# Patient Record
Sex: Female | Born: 1995 | Race: White | Hispanic: No | Marital: Single | Smoking: Never smoker
Health system: Southern US, Community
[De-identification: ages and names within clinical notes are randomized; demographics above are authoritative.]

## PROBLEM LIST (undated history)

## (undated) DIAGNOSIS — Z789 Other specified health status: Secondary | ICD-10-CM

## (undated) HISTORY — PX: TONSILLECTOMY: SUR1361

---
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
## 2015-07-06 ENCOUNTER — Ambulatory Visit: Payer: Self-pay | Admitting: General Surgery

## 2015-07-13 ENCOUNTER — Ambulatory Visit (INDEPENDENT_AMBULATORY_CARE_PROVIDER_SITE_OTHER): Payer: BLUE CROSS/BLUE SHIELD | Admitting: General Surgery

## 2015-07-13 ENCOUNTER — Encounter: Payer: Self-pay | Admitting: General Surgery

## 2015-07-13 VITALS — BP 142/76 | HR 82 | Resp 14 | Ht 68.0 in | Wt 151.0 lb

## 2015-07-13 DIAGNOSIS — K824 Cholesterolosis of gallbladder: Secondary | ICD-10-CM | POA: Insufficient documentation

## 2015-07-13 NOTE — Patient Instructions (Addendum)
Laparoscopic Cholecystectomy Laparoscopic cholecystectomy is surgery to remove the gallbladder. The gallbladder is located in the upper right part of the abdomen, behind the liver. It is a storage sac for bile, which is produced in the liver. Bile aids in the digestion and absorption of fats. Cholecystectomy is often done for inflammation of the gallbladder (cholecystitis). This condition is usually caused by a buildup of gallstones (cholelithiasis) in the gallbladder. Gallstones can block the flow of bile, and that can result in inflammation and pain. In severe cases, emergency surgery may be required. If emergency surgery is not required, you will have time to prepare for the procedure. Laparoscopic surgery is an alternative to open surgery. Laparoscopic surgery has a shorter recovery time. Your common bile duct may also need to be examined during the procedure. If stones are found in the common bile duct, they may be removed. LET YOUR HEALTH CARE PROVIDER KNOW ABOUT:  Any allergies you have.  All medicines you are taking, including vitamins, herbs, eye drops, creams, and over-the-counter medicines.  Previous problems you or members of your family have had with the use of anesthetics.  Any blood disorders you have.  Previous surgeries you have had.  Any medical conditions you have. RISKS AND COMPLICATIONS Generally, this is a safe procedure. However, problems may occur, including:  Infection.  Bleeding.  Allergic reactions to medicines.  Damage to other structures or organs.  A stone remaining in the common bile duct.  A bile leak from the cyst duct that is clipped when your gallbladder is removed.  The need to convert to open surgery, which requires a larger incision in the abdomen. This may be necessary if your surgeon thinks that it is not safe to continue with a laparoscopic procedure. BEFORE THE PROCEDURE  Ask your health care provider about:  Changing or stopping your  regular medicines. This is especially important if you are taking diabetes medicines or blood thinners.  Taking medicines such as aspirin and ibuprofen. These medicines can thin your blood. Do not take these medicines before your procedure if your health care provider instructs you not to.  Follow instructions from your health care provider about eating or drinking restrictions.  Let your health care provider know if you develop a cold or an infection before surgery.  Plan to have someone take you home after the procedure.  Ask your health care provider how your surgical site will be marked or identified.  You may be given antibiotic medicine to help prevent infection. PROCEDURE  To reduce your risk of infection:  Your health care team will wash or sanitize their hands.  Your skin will be washed with soap.  An IV tube may be inserted into one of your veins.  You will be given a medicine to make you fall asleep (general anesthetic).  A breathing tube will be placed in your mouth.  The surgeon will make several small cuts (incisions) in your abdomen.  A thin, lighted tube (laparoscope) that has a tiny camera on the end will be inserted through one of the small incisions. The camera on the laparoscope will send a picture to a TV screen (monitor) in the operating room. This will give the surgeon a good view inside your abdomen.  A gas will be pumped into your abdomen. This will expand your abdomen to give the surgeon more room to perform the surgery.  Other tools that are needed for the procedure will be inserted through the other incisions. The gallbladder will   be removed through one of the incisions.  After your gallbladder has been removed, the incisions will be closed with stitches (sutures), staples, or skin glue.  Your incisions may be covered with a bandage (dressing). The procedure may vary among health care providers and hospitals. AFTER THE PROCEDURE  Your blood  pressure, heart rate, breathing rate, and blood oxygen level will be monitored often until the medicines you were given have worn off.  You will be given medicines as needed to control your pain.   This information is not intended to replace advice given to you by your health care provider. Make sure you discuss any questions you have with your health care provider.   Document Released: 09/19/2005 Document Revised: 06/10/2015 Document Reviewed: 05/01/2013 Elsevier Interactive Patient Education 2016 [employer]  Patient's surgery has been scheduled for 09-17-15 at [facility].

## 2015-07-13 NOTE — Progress Notes (Signed)
Patient ID: Laura Goodman, female   DOB: 1996-01-22, 19 y.o.   MRN: [id number]  Chief Complaint  Patient presents with  . Other    gallbladder  polyps    HPI Laura Goodman is a 19 y.o. female here today for evaluation for gallbladder polyps. Patient had an ultrasound done at [facility] on 05/18/15. Patient states the pain has been going on for 2 months, pain does come and go it is located near her right upper quadrant area. She reports she has bowel movements after every meal since the pain has started. No specific foods trigger the bowel movements after she eats. The bowel movements occur within an hour of meals. She will not necessarily have pain with all solid oral intake. No radiation of the pain into the back, but it does move into the right lateral abdominal wall. 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  The patient has been making use of oral contraception is for 3 years without difficulty. No other medication changes.  HPI  No past medical history on file.  Past Surgical History  Procedure Laterality Date  . Wisdom tooth extraction  2015    Family History  Problem Relation Age of Onset  . Ehlers-Danlos syndrome Sister   . Polycystic ovary syndrome Sister   . Cancer Paternal Aunt     Breast  . Cancer Paternal Grandmother     Colon     Social History Social History  Substance Use Topics  . Smoking status: Never Smoker   . Smokeless tobacco: None  . Alcohol Use: No    Allergies  Allergen Reactions  . Other Rash    Uncoded Allergy. Allergen: CEFZIL    Current Outpatient Prescriptions  Medication Sig Dispense Refill  . Multiple Vitamins-Minerals (MULTIVITAMIN WITH MINERALS) tablet Take 1 tablet by mouth daily.    . TRI-SPRINTEC 0.18/0.215/0.25 MG-35 MCG tablet      No current facility-administered medications for this visit.    Review of Systems Review of Systems  Constitutional: Negative.   Respiratory: Negative.   Cardiovascular:  Negative.   Gastrointestinal: Positive for abdominal pain and diarrhea.    Blood pressure 142/76, pulse 82, resp. rate 14, height  (1.727 m), weight 151 lb (68.493 kg), last menstrual period 07/07/2015.  Physical Exam Physical Exam  Constitutional: She is oriented to person, place, and time. She appears well-developed and well-nourished.  Eyes: Conjunctivae are normal. No scleral icterus.  Neck: Neck supple.  Cardiovascular: Normal rate, regular rhythm and normal heart sounds.   Pulmonary/Chest: Effort normal and breath sounds normal.  Abdominal: Soft. Bowel sounds are normal.  Lymphadenopathy:    She has no cervical adenopathy.  Neurological: She is alert and oriented to person, place, and time.  Skin: Skin is warm and dry.    Data Reviewed [facility] notes dated 05/12/2015 as well as associated laboratory and radiologic studies were reviewed.  Urinalysis was negative.  Follow-up exam on 06/03/2015 showed a normal hemogram with a hemoglobin of 13, white blood cell count of 5900, normal competence metabolic panel.  Abdominal ultrasound completed at [facility] showed a 7.3 m (centimeters recorded) echogenic focus in the neck of the gallbladder possibly related to gallbladder polyp. No definite gallstones. No gallbladder wall thickening or pericholecystic fluid. 3 month follow-up recommended.  Assessment    Biliary colic, gallbladder polyp.    Plan    Options for management were reviewed with the patient and her father, Laura Goodman. 1)  observation with follow-up ultrasounds to confirm the polyp resolves or stasis stable in size versus 2) elective cholecystectomy.  At this time the patient is looking to proceed with gallbladder removal, but does not want to miss school. Her pain is not so severe at present, and with no stones identified on ultrasound the risk of acute cholecystitis is small, and choledocholithiasis is absent.     Laparoscopic Cholecystectomy with  Intraoperative Cholangiogram. The procedure, including it's potential risks and complications (including but not limited to infection, bleeding, injury to intra-abdominal organs or bile ducts, bile leak, poor cosmetic result, sepsis and death) were discussed with the patient in detail. Non-operative options, including their inherent risks (acute calculous cholecystitis with possible choledocholithiasis or gallstone pancreatitis, with the risk of ascending cholangitis, sepsis, and death) were discussed as well. The patient expressed and understanding of what we discussed and wishes to proceed with laparoscopic cholecystectomy. The patient further understands that if it is technically not possible, or it is unsafe to proceed laparoscopically, that I will convert to an open cholecystectomy.  The patient was encouraged to call if the pain episodes become more severe, certainly if she develops nausea and vomiting.  Patient's surgery has been scheduled for 09-17-15 at [facility].   PCP: Guerry Bruin, MD   Earline Mayotte 07/13/2015, 4:30 PM

## 2015-09-07 ENCOUNTER — Encounter: Payer: Self-pay | Admitting: *Deleted

## 2015-09-07 ENCOUNTER — Other Ambulatory Visit: Payer: Self-pay

## 2015-09-07 NOTE — Patient Instructions (Signed)
  Your procedure is scheduled on: 09-17-15 Report to [facility] 2ND FLOOR To find out your arrival time please call [phone] between 1PM - 3PM on 09-16-15  Remember: Instructions that are not followed completely may result in serious medical risk, up to and including death, or upon the discretion of your surgeon and anesthesiologist your surgery may need to be rescheduled.    _X___ 1. Do not eat food or drink liquids after midnight. No gum chewing or hard candies.     _X___ 2. No Alcohol for 24 hours before or after surgery.   ____ 3. Bring all medications with you on the day of surgery if instructed.    ____ 4. Notify your doctor if there is any change in your medical condition     (cold, fever, infections).     Do not wear jewelry, make-up, hairpins, clips or nail polish.  Do not wear lotions, powders, or perfumes. You may wear deodorant.  Do not shave 48 hours prior to surgery. Men may shave face and neck.  Do not bring valuables to the hospital.    [facility] any belongings or valuables.               Contacts, dentures or bridgework may not be worn into surgery.  Leave your suitcase in the car. After surgery it may be brought to your room.  For patients admitted to the hospital, discharge time is determined by your  treatment team.   Patients discharged the day of surgery will not be allowed to drive home.   Please read over the following fact sheets that you were given:      ____ Take these medicines the morning of surgery with A SIP OF WATER:    1. NONE  2.   3.   4.  5.  6.  ____ Fleet Enema (as directed)   ____ Use CHG Soap as directed  ____ Use inhalers on the day of surgery  ____ Stop metformin 2 days prior to surgery    ____ Take 1/2 of usual insulin dose the night before surgery and none on the morning of surgery.   ____ Stop Coumadin/Plavix/aspirin-N/A  ____ Stop Anti-inflammatories   ____ Stop  supplements until after surgery.    ____ Bring C-Pap to the hospital.

## 2015-09-17 ENCOUNTER — Ambulatory Visit: Payer: BLUE CROSS/BLUE SHIELD

## 2015-09-17 ENCOUNTER — Ambulatory Visit
Admission: RE | Admit: 2015-09-17 | Discharge: 2015-09-17 | Disposition: A | Discharge status: Home / Self Care | Payer: BLUE CROSS/BLUE SHIELD | Source: Ambulatory Visit | Attending: General Surgery | Admitting: General Surgery

## 2015-09-17 ENCOUNTER — Ambulatory Visit: Payer: BLUE CROSS/BLUE SHIELD | Admitting: Anesthesiology

## 2015-09-17 ENCOUNTER — Encounter
Admission: RE | Disposition: A | Discharge status: Home / Self Care | Payer: Self-pay | Source: Ambulatory Visit | Attending: General Surgery

## 2015-09-17 DIAGNOSIS — J029 Acute pharyngitis, unspecified: Secondary | ICD-10-CM | POA: Diagnosis not present

## 2015-09-17 DIAGNOSIS — K824 Cholesterolosis of gallbladder: Secondary | ICD-10-CM | POA: Diagnosis not present

## 2015-09-17 DIAGNOSIS — K8010 Calculus of gallbladder with chronic cholecystitis without obstruction: Secondary | ICD-10-CM | POA: Diagnosis present

## 2015-09-17 DIAGNOSIS — R109 Unspecified abdominal pain: Secondary | ICD-10-CM | POA: Diagnosis not present

## 2015-09-17 DIAGNOSIS — K8020 Calculus of gallbladder without cholecystitis without obstruction: Secondary | ICD-10-CM

## 2015-09-17 HISTORY — DX: Other specified health status: Z78.9

## 2015-09-17 HISTORY — PX: CHOLECYSTECTOMY: SHX55

## 2015-09-17 LAB — POCT PREGNANCY, URINE: PREG TEST UR: NEGATIVE

## 2015-09-17 SURGERY — LAPAROSCOPIC CHOLECYSTECTOMY WITH INTRAOPERATIVE CHOLANGIOGRAM
Anesthesia: General | Wound class: Clean Contaminated

## 2015-09-17 MED ORDER — ACETAMINOPHEN 10 MG/ML IV SOLN
INTRAVENOUS | Status: DC | PRN
  Administered 2015-09-17: 1000 mg via INTRAVENOUS

## 2015-09-17 MED ORDER — LIDOCAINE HCL (CARDIAC) 20 MG/ML IV SOLN
INTRAVENOUS | Status: DC | PRN
  Administered 2015-09-17: 100 mg via INTRAVENOUS

## 2015-09-17 MED ORDER — GLYCOPYRROLATE 0.2 MG/ML IJ SOLN
INTRAMUSCULAR | Status: DC | PRN
  Administered 2015-09-17: .8 mg via INTRAVENOUS

## 2015-09-17 MED ORDER — DEXAMETHASONE SODIUM PHOSPHATE 10 MG/ML IJ SOLN
INTRAMUSCULAR | Status: DC | PRN
  Administered 2015-09-17: 10 mg via INTRAVENOUS

## 2015-09-17 MED ORDER — FAMOTIDINE 20 MG PO TABS
20.0000 mg | ORAL_TABLET | Freq: Once | ORAL | Status: AC
  Administered 2015-09-17: 20 mg via ORAL

## 2015-09-17 MED ORDER — PROMETHAZINE HCL 25 MG/ML IJ SOLN: 12.5000 mg | INTRAMUSCULAR | Status: DC | PRN

## 2015-09-17 MED ORDER — ROCURONIUM BROMIDE 100 MG/10ML IV SOLN
INTRAVENOUS | Status: DC | PRN
  Administered 2015-09-17: 10 mg via INTRAVENOUS
  Administered 2015-09-17: 30 mg via INTRAVENOUS

## 2015-09-17 MED ORDER — LACTATED RINGERS IV SOLN
INTRAVENOUS | Status: DC
  Administered 2015-09-17 (×3): via INTRAVENOUS

## 2015-09-17 MED ORDER — PROPOFOL 10 MG/ML IV BOLUS
INTRAVENOUS | Status: DC | PRN
  Administered 2015-09-17: 150 mg via INTRAVENOUS

## 2015-09-17 MED ORDER — SODIUM CHLORIDE 0.9 % IJ SOLN
INTRAMUSCULAR | Status: AC
  Filled 2015-09-17: qty 50

## 2015-09-17 MED ORDER — FENTANYL CITRATE (PF) 100 MCG/2ML IJ SOLN
25.0000 ug | INTRAMUSCULAR | Status: DC | PRN
  Administered 2015-09-17 (×4): 25 ug via INTRAVENOUS

## 2015-09-17 MED ORDER — HYDROCODONE-ACETAMINOPHEN 5-325 MG PO TABS: 1.0000 | ORAL_TABLET | ORAL | Status: DC | PRN

## 2015-09-17 MED ORDER — FENTANYL CITRATE (PF) 100 MCG/2ML IJ SOLN
INTRAMUSCULAR | Status: DC | PRN
  Administered 2015-09-17: 100 ug via INTRAVENOUS
  Administered 2015-09-17: 50 ug via INTRAVENOUS

## 2015-09-17 MED ORDER — CLINDAMYCIN PHOSPHATE 600 MG/50ML IV SOLN
INTRAVENOUS | Status: AC
  Filled 2015-09-17: qty 50

## 2015-09-17 MED ORDER — HYDROMORPHONE HCL 1 MG/ML IJ SOLN
INTRAMUSCULAR | Status: DC | PRN
  Administered 2015-09-17 (×2): .6 mg via INTRAVENOUS

## 2015-09-17 MED ORDER — SODIUM CHLORIDE 0.9 % IV SOLN
INTRAVENOUS | Status: DC | PRN
  Administered 2015-09-17: 10 mL

## 2015-09-17 MED ORDER — NEOSTIGMINE METHYLSULFATE 10 MG/10ML IV SOLN
INTRAVENOUS | Status: DC | PRN
  Administered 2015-09-17: 4 mg via INTRAVENOUS

## 2015-09-17 MED ORDER — HYDROCODONE-ACETAMINOPHEN 5-325 MG PO TABS
1.0000 | ORAL_TABLET | ORAL | Status: DC | PRN
  Administered 2015-09-17: 1 via ORAL

## 2015-09-17 MED ORDER — DEXMEDETOMIDINE HCL 200 MCG/2ML IV SOLN
INTRAVENOUS | Status: DC | PRN
  Administered 2015-09-17: 8 ug via INTRAVENOUS

## 2015-09-17 MED ORDER — CLINDAMYCIN PHOSPHATE 600 MG/50ML IV SOLN
INTRAVENOUS | Status: DC | PRN
  Administered 2015-09-17: 600 mg via INTRAVENOUS

## 2015-09-17 MED ORDER — ACETAMINOPHEN 10 MG/ML IV SOLN
INTRAVENOUS | Status: AC
  Filled 2015-09-17: qty 100

## 2015-09-17 MED ORDER — ONDANSETRON HCL 4 MG/2ML IJ SOLN
4.0000 mg | Freq: Once | INTRAMUSCULAR | Status: AC | PRN
  Administered 2015-09-17 (×2): 4 mg via INTRAVENOUS

## 2015-09-17 MED ORDER — KETOROLAC TROMETHAMINE 30 MG/ML IJ SOLN
INTRAMUSCULAR | Status: DC | PRN
  Administered 2015-09-17: 30 mg via INTRAVENOUS

## 2015-09-17 SURGICAL SUPPLY — 40 items
APPLIER CLIP ROT 10 11.4 M/L (STAPLE) ×2
BENZOIN TINCTURE PRP APPL 2/3 (GAUZE/BANDAGES/DRESSINGS) ×2 IMPLANT
BLADE SURG 11 STRL SS SAFETY (MISCELLANEOUS) ×2 IMPLANT
CANISTER SUCT 1200ML W/VALVE (MISCELLANEOUS) ×2 IMPLANT
CANNULA DILATOR 10 W/SLV (CANNULA) ×2 IMPLANT
CANNULA DILATOR 5 W/SLV (CANNULA) ×4 IMPLANT
CATH CHOLANG 76X19 KUMAR (CATHETERS) ×2 IMPLANT
CHLORAPREP W/TINT 26ML (MISCELLANEOUS) ×2 IMPLANT
CLIP APPLIE ROT 10 11.4 M/L (STAPLE) ×1 IMPLANT
CONRAY 60ML FOR OR (MISCELLANEOUS) ×2 IMPLANT
DISSECTOR KITTNER STICK (MISCELLANEOUS) IMPLANT
DISSECTORS/KITTNER STICK (MISCELLANEOUS)
DRAPE SHEET LG 3/4 BI-LAMINATE (DRAPES) ×2 IMPLANT
DRESSING TELFA 4X3 1S ST N-ADH (GAUZE/BANDAGES/DRESSINGS) ×2 IMPLANT
DRSG TEGADERM 2-3/8X2-3/4 SM (GAUZE/BANDAGES/DRESSINGS) ×8 IMPLANT
DRSG TEGADERM 2X2.25 PEDS (GAUZE/BANDAGES/DRESSINGS) ×2 IMPLANT
ENDOPOUCH RETRIEVER 10 (MISCELLANEOUS) IMPLANT
GLOVE BIO SURGEON STRL SZ7.5 (GLOVE) ×6 IMPLANT
GLOVE INDICATOR 8.0 STRL GRN (GLOVE) ×6 IMPLANT
GOWN STRL REUS W/ TWL LRG LVL3 (GOWN DISPOSABLE) ×3 IMPLANT
GOWN STRL REUS W/TWL LRG LVL3 (GOWN DISPOSABLE) ×3
IRRIGATION STRYKERFLOW (MISCELLANEOUS) ×1 IMPLANT
IRRIGATOR STRYKERFLOW (MISCELLANEOUS) ×2
IV LACTATED RINGERS 1000ML (IV SOLUTION) ×2 IMPLANT
KIT RM TURNOVER STRD PROC AR (KITS) ×2 IMPLANT
LABEL OR SOLS (LABEL) ×2 IMPLANT
NDL INSUFF ACCESS 14 VERSASTEP (NEEDLE) ×2 IMPLANT
NS IRRIG 500ML POUR BTL (IV SOLUTION) ×2 IMPLANT
PACK LAP CHOLECYSTECTOMY (MISCELLANEOUS) ×2 IMPLANT
PAD GROUND ADULT SPLIT (MISCELLANEOUS) ×2 IMPLANT
SCISSORS METZENBAUM CVD 33 (INSTRUMENTS) ×2 IMPLANT
SEAL FOR SCOPE WARMER C3101 (MISCELLANEOUS) ×2 IMPLANT
STRIP CLOSURE SKIN 1/2X4 (GAUZE/BANDAGES/DRESSINGS) ×2 IMPLANT
SUT PROLENE 2 0 SH DA (SUTURE) ×2 IMPLANT
SUT VIC AB 0 CT2 27 (SUTURE) ×2 IMPLANT
SUT VIC AB 4-0 FS2 27 (SUTURE) ×2 IMPLANT
SWABSTK COMLB BENZOIN TINCTURE (MISCELLANEOUS) ×2 IMPLANT
TROCAR XCEL NON-BLD 11X100MML (ENDOMECHANICALS) ×2 IMPLANT
TUBING INSUFFLATOR HI FLOW (MISCELLANEOUS) ×2 IMPLANT
WATER STERILE IRR 1000ML POUR (IV SOLUTION) ×2 IMPLANT

## 2015-09-17 NOTE — H&P (Signed)
Sylvie Farrierlanna R Scully [id number] 04/30/1996     HPI: 19 year old female with abdominal pain previous ultrasound showing evidence of a suspected gallbladder polyp in the neck. Episodic pain continues in the right upper quadrant.  24-hour history of mild sore throat and sinus drainage. No cough. No urinary symptoms.  Prescriptions prior to admission  Medication Sig Dispense Refill Last Dose  . Multiple Vitamins-Minerals (MULTIVITAMIN WITH MINERALS) tablet Take 1 tablet by mouth daily.   Past Week at Unknown time  . TRI-SPRINTEC 0.18/0.215/0.25 MG-35 MCG tablet    09/16/2015 at Unknown time   Allergies  Allergen Reactions  . Other Rash    Uncoded Allergy. Allergen: CEFZIL   Past Medical History  Diagnosis Date  . Medical history non-contributory    Past Surgical History  Procedure Laterality Date  . Wisdom tooth extraction  2015   Social History   Social History  . Marital Status: Single    Spouse Name: N/A  . Number of Children: N/A  . Years of Education: N/A   Occupational History  . Not on file.   Social History Main Topics  . Smoking status: Never Smoker   . Smokeless tobacco: Not on file  . Alcohol Use: No  . Drug Use: No  . Sexual Activity: Not on file   Other Topics Concern  . Not on file   Social History Narrative   Social History   Social History Narrative     ROS: Negative.     PE: HEENT: Moderate pharyngitis noted over the tonsils. Lungs: Clear. Cardio: RR. Abdomen: Soft. Earline MayotteByrnett, Camira Geidel W 09/17/2015   Assessment/Plan:  Temperature noted this morning of 100.1 likely secondary to URI symptoms. Suturing the patient's persistent abdominal pain, we'll proceed with planned cholecystectomy. Patient will receive Ancef on induction of anesthesia.

## 2015-09-17 NOTE — Op Note (Signed)
Preoperative diagnosis: Chronic cholecystitis, gallbladder polyp.  Postoperative diagnosis: Chronic cholecystitis and cholelithiasis.  Operative procedure: Laparoscopic cholecystectomy with intraoperative cholangiograms.  Operating surgeon: Ollen Bowl, M.D.  Anesthesia: Gen. endotracheal.  Estimated blood loss: 5 mL.  Clinical note: This 19 year old woman has had episodic postprandial abdominal pain. Ultrasound showed evidence of a gallbladder polyp. No gallbladder wall thickening. Normal common bile duct. She is felt to be a candidate for elective cholecystectomy. The morning of surgery she was noted to have low-grade fever 100.1 with erythema and exudate of the tonsils. No other symptoms or clinical findings. She received clindamycin 6 her milligrams prior to procedure.  Operative note: With the patient under adequate general anesthesia the abdomen was prepped with ChloraPrep and draped. A varies needle was placed which answered umbilical incision. In spite of a normal hanging drop test insufflation was abnormal. It appeared that the needle was in the preperitoneal space. On initial passage of the 10 mm step port showed that the catheter was indeed in the preperitoneal space. It was elected BRCA2 a open technique. The incision was extended cephalad and the fascia incised and the peritoneum entered. A 2-0 Prolene baseball stitch was placed and pneumoperitoneum undertaken at 10 mmHg pressure with a step port. An 11 mm XL port was placed in the epigastrium. Examination of the umbilical area showed evidence of air insufflated into the falciform ligament as well as into the soft tissues between the peritoneum and the abdominal fascia. No evidence of bleeding. No evidence of bowel injury.  At the patient in reverse Trendelenburg position and rolled to left 25 mm step ports were placed laterally. The gallbladder was noted to be tensely distended although not inflamed. This was decompressed with a  needle catheter. Clear yellow bile was noted. The gallbladder was placed on cephalad traction. The neck of the gallbladder was clear. Fluoroscopic cholangiograms were completed using one half strength Conray 60, 10 mL volume. This showed prompt filling of the right and left hepatic ducts, free flow in the duodenum and no evidence of retained stones. The cystic duct and single branch the cystic artery was doubly clipped and divided. The gallbladder is removed the liver bed making use of hook cautery dissection and delivered to the umbilical port site. At the end of the procedure the open gallbladder showed multiple stones. The right upper quadrant was irrigated with lactated Ringer solution. Good hemostasis was noted. Final inspection from the epigastric site showed no abnormalities outside of those noted above. No evidence of intestinal injury. The abdomen was then desufflated under direct vision and ports removed. The stitch at the umbilicus was snugged down to close the fascial defect. Skin incisions were closed with interrupted 4-0 Vicryl septic sutures. Benzoin, Steri-Strips, Telfa and Tegaderm dressings were then applied.  The patient tolerated the procedure well and was taken to recovery room stable condition

## 2015-09-17 NOTE — Addendum Note (Signed)
Addendum  created 09/17/15 1315 by Almeta MonasPatricia Iva Montelongo, CRNA   Modules edited: Anesthesia Attestations

## 2015-09-17 NOTE — OR Nursing (Signed)
Dr Lemar LivingsByrnett notified of patient temp 100.  No new orders at this time.

## 2015-09-17 NOTE — Anesthesia Postprocedure Evaluation (Signed)
Anesthesia Post Note  Patient: Laura FarrierAlanna R Goodman  Procedure(s) Performed: Procedure(s) (LRB): LAPAROSCOPIC CHOLECYSTECTOMY WITH INTRAOPERATIVE CHOLANGIOGRAM (N/A)  Patient location during evaluation: PACU Anesthesia Type: General Level of consciousness: awake and alert Pain management: pain level controlled Vital Signs Assessment: post-procedure vital signs reviewed and stable Respiratory status: spontaneous breathing, nonlabored ventilation, respiratory function stable and patient connected to nasal cannula oxygen Cardiovascular status: blood pressure returned to baseline and stable Postop Assessment: no signs of nausea or vomiting Anesthetic complications: no    Last Vitals:  Filed Vitals:   09/17/15 1015 09/17/15 1056  BP: 89/44 86/54  Pulse: 80 76  Temp: 36.4 C   Resp: 16 16    Last Pain:  Filed Vitals:   09/17/15 1057  PainSc: 8                  Harper Vandervoort K Juel Bellerose

## 2015-09-17 NOTE — Discharge Instructions (Signed)

## 2015-09-17 NOTE — OR Nursing (Signed)
Dr. Lemar LivingsByrnett here.  Ordered Dc home.  Mother feels comfortable with care at home.  Patient is still sleepy but pain is somewhat improved.

## 2015-09-17 NOTE — Anesthesia Preprocedure Evaluation (Signed)
Anesthesia Evaluation  Patient identified by MRN, date of birth, ID band Patient awake    Reviewed: Allergy & Precautions, NPO status , Patient's Chart, lab work & pertinent test results, reviewed documented beta blocker date and time   Airway Mallampati: II  TM Distance: >3 FB     Dental  (+) Chipped   Pulmonary           Cardiovascular      Neuro/Psych    GI/Hepatic   Endo/Other    Renal/GU      Musculoskeletal   Abdominal   Peds  Hematology   Anesthesia Other Findings   Reproductive/Obstetrics                             Anesthesia Physical Anesthesia Plan  ASA: II  Anesthesia Plan: General   Post-op Pain Management:    Induction: Intravenous  Airway Management Planned: Oral ETT  Additional Equipment:   Intra-op Plan:   Post-operative Plan:   Informed Consent: I have reviewed the patients History and Physical, chart, labs and discussed the procedure including the risks, benefits and alternatives for the proposed anesthesia with the patient or authorized representative who has indicated his/her understanding and acceptance.     Plan Discussed with: CRNA  Anesthesia Plan Comments:         Anesthesia Quick Evaluation  

## 2015-09-17 NOTE — Anesthesia Procedure Notes (Signed)
Procedure Name: Intubation Date/Time: 09/17/2015 7:49 AM Performed by: Almeta MonasFLETCHER, Linnie Delgrande Pre-anesthesia Checklist: Patient identified, Emergency Drugs available, Suction available and Patient being monitored Patient Re-evaluated:Patient Re-evaluated prior to inductionOxygen Delivery Method: Circle system utilized Preoxygenation: Pre-oxygenation with 100% oxygen Intubation Type: IV induction Ventilation: Mask ventilation without difficulty Laryngoscope Size: Mac and 3 Grade View: Grade I Tube type: Oral Tube size: 7.0 mm Number of attempts: 1 Airway Equipment and Method: Stylet and Patient positioned with wedge pillow Placement Confirmation: ETT inserted through vocal cords under direct vision,  positive ETCO2,  CO2 detector and breath sounds checked- equal and bilateral Secured at: 21 cm Tube secured with: Tape Dental Injury: Teeth and Oropharynx as per pre-operative assessment

## 2015-09-17 NOTE — Transfer of Care (Signed)
Immediate Anesthesia Transfer of Care Note  Patient: Laura FarrierAlanna R Goodman  Procedure(s) Performed: Procedure(s): LAPAROSCOPIC CHOLECYSTECTOMY WITH INTRAOPERATIVE CHOLANGIOGRAM (N/A)  Patient Location: PACU  Anesthesia Type:General  Level of Consciousness: sedated  Airway & Oxygen Therapy: Patient Spontanous Breathing and Patient connected to face mask oxygen  Post-op Assessment: Report given to RN and Post -op Vital signs reviewed and stable  Post vital signs: Reviewed and stable  Last Vitals:  Filed Vitals:   09/17/15 0603 09/17/15 0900  BP: 118/67 107/45  Pulse: 111 114  Temp: 37.8 C 36.7 C  Resp: 14 14    Complications: No apparent anesthesia complications

## 2015-09-18 ENCOUNTER — Telehealth: Payer: Self-pay | Admitting: General Surgery

## 2015-09-18 DIAGNOSIS — Z9889 Other specified postprocedural states: Principal | ICD-10-CM

## 2015-09-18 DIAGNOSIS — R110 Nausea: Secondary | ICD-10-CM

## 2015-09-18 LAB — SURGICAL PATHOLOGY

## 2015-09-18 MED ORDER — PROMETHAZINE HCL 25 MG PO TABS: 25.0000 mg | ORAL_TABLET | ORAL | Status: AC | PRN

## 2015-09-18 NOTE — Telephone Encounter (Signed)
19 year old woman underwent a laparoscopic cholecystectomy for chronic cholecystitis and cholelithiasis S today. Her mother reported that she had done well with liquid just day had a soft pale today and then this afternoon had vomiting. No fever or chills. Lower abdominal pain secondary to air dissection in the preperitoneal plane is still present.  Had about a half liter of fluids today.  Aprescription for Phenergan tablets will be sent to her pharmacy.  The importance of liquids over solids for the next 24 hours was emphasized.

## 2015-09-24 ENCOUNTER — Encounter: Payer: Self-pay | Admitting: General Surgery

## 2015-09-24 ENCOUNTER — Ambulatory Visit (INDEPENDENT_AMBULATORY_CARE_PROVIDER_SITE_OTHER): Payer: BLUE CROSS/BLUE SHIELD | Admitting: General Surgery

## 2015-09-24 DIAGNOSIS — K8020 Calculus of gallbladder without cholecystitis without obstruction: Secondary | ICD-10-CM

## 2015-09-24 NOTE — Progress Notes (Signed)
Patient ID: Laura FarrierAlanna R Goodman, female   DOB: 04/01/1996, 19 y.o.   MRN: [id number]  Chief Complaint  Patient presents with  . Routine Post Op    gallbladder    HPI Laura Farrierlanna R Goodman is a 19 y.o. female here today for her post op gallbladder removal done on 09/17/15. She developed nausea and vomiting on postoperative day 1 which resolved with use of oral Phenergan. Presently not requiring any analgesics. Patient states she is doing well.   I personally reviewed the history.  HPI  Past Medical History  Diagnosis Date  . Medical history non-contributory     Past Surgical History  Procedure Laterality Date  . Wisdom tooth extraction  2015  . Cholecystectomy N/A 09/17/2015    Procedure: LAPAROSCOPIC CHOLECYSTECTOMY WITH INTRAOPERATIVE CHOLANGIOGRAM;  Surgeon: Earline MayotteJeffrey W Jedi Catalfamo, MD;  Location: [facility] ORS;  Service: General;  Laterality: N/A;    Family History  Problem Relation Age of Onset  . Ehlers-Danlos syndrome Sister   . Polycystic ovary syndrome Sister   . Cancer Paternal Aunt     Breast  . Cancer Paternal Grandmother     Colon     Social History Social History  Substance Use Topics  . Smoking status: Never Smoker   . Smokeless tobacco: None  . Alcohol Use: No    Allergies  Allergen Reactions  . Other Rash    Uncoded Allergy. Allergen: CEFZIL    Current Outpatient Prescriptions  Medication Sig Dispense Refill  . Multiple Vitamins-Minerals (MULTIVITAMIN WITH MINERALS) tablet Take 1 tablet by mouth daily.    . promethazine (PHENERGAN) 25 MG tablet Take 1 tablet (25 mg total) by mouth every 4 (four) hours as needed for nausea or vomiting. 30 tablet 0  . TRI-SPRINTEC 0.18/0.215/0.25 MG-35 MCG tablet      No current facility-administered medications for this visit.    Review of Systems Review of Systems  Constitutional: Negative.   Respiratory: Negative.   Cardiovascular: Negative.     Blood pressure 116/80, pulse 84, resp. rate 12, height 5\' 8"  (1.727 m), weight 144  lb (65.318 kg), last menstrual period 08/31/2015.  Physical Exam Physical Exam  Constitutional: She is oriented to person, place, and time. She appears well-developed and well-nourished.  Cardiovascular: Normal rate, regular rhythm and normal heart sounds.   Pulmonary/Chest: Effort normal and breath sounds normal.  Abdominal: Soft. Normal appearance. There is no tenderness.  Neurological: She is alert and oriented to person, place, and time.  Skin: Skin is warm and dry.  Psychiatric: Her behavior is normal.    Data Reviewed Pathology showed chronic cholecystitis. The stones evident in the operating room were not in the specimen container.  Assessment    Doing well status post cholecystectomy.    Plan        Gradually increase activity. Follow up as needed. The patient is aware to call back for any questions or concerns.   PCP:  Guerry BruinMakar, Vivian This information has been scribed by Ples SpecterJessica Qualls CMA.    Earline MayotteByrnett, Lavella Myren W 09/25/2015, 1:16 PM

## 2015-09-24 NOTE — Patient Instructions (Signed)
Gradually increase activity. Follow up as needed. The patient is aware to call back for any questions or concerns.

## 2015-09-25 DIAGNOSIS — K8020 Calculus of gallbladder without cholecystitis without obstruction: Secondary | ICD-10-CM | POA: Insufficient documentation

## 2020-10-27 ENCOUNTER — Encounter: Payer: Self-pay | Admitting: Dermatology

## 2020-10-27 ENCOUNTER — Other Ambulatory Visit: Payer: Self-pay

## 2020-10-27 ENCOUNTER — Ambulatory Visit: Payer: BLUE CROSS/BLUE SHIELD | Admitting: Dermatology

## 2020-10-27 DIAGNOSIS — L858 Other specified epidermal thickening: Secondary | ICD-10-CM | POA: Diagnosis not present

## 2020-10-27 DIAGNOSIS — L739 Follicular disorder, unspecified: Secondary | ICD-10-CM

## 2020-10-27 DIAGNOSIS — L700 Acne vulgaris: Secondary | ICD-10-CM

## 2020-10-27 MED ORDER — DOXYCYCLINE MONOHYDRATE 100 MG PO CAPS: 100.0000 mg | ORAL_CAPSULE | Freq: Two times a day (BID) | ORAL | 2 refills | Status: DC

## 2020-10-27 MED ORDER — DAPSONE 7.5 % EX GEL: CUTANEOUS | 2 refills | Status: DC

## 2020-10-27 MED ORDER — TRETINOIN 0.025 % EX CREA: TOPICAL_CREAM | Freq: Every evening | CUTANEOUS | 2 refills | Status: DC

## 2020-10-27 NOTE — Progress Notes (Signed)
   Follow-Up Visit   Subjective  Laura Goodman is a 25 y.o. female who presents for the following: Acne (Patient here today for acne. She did a 6 month course of accutane just over 2 years ago with good results and no side effects. She is currently not using any medications. 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 ).  Patient has some little red dots on upper arms and buttocks that started a few months ago.  The following portions of the chart were reviewed this encounter and updated as appropriate:   Allergies  Meds  Problems  Med Hx  Surg Hx  Fam Hx      Review of Systems:  No other skin or systemic complaints except as noted in HPI or Assessment and Plan.  Objective  Well appearing patient in no apparent distress; mood and affect are within normal limits.  A focused examination was performed including face, neck, chest and back and buttocks, arms. Relevant physical exam findings are noted in the Assessment and Plan.  Objective  Upper Arms: Tiny follicular keratotic papules.   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   Recommend ammonium lactate lotion daily or exfoliating gloves. Patient advised no cure but exfoliation can help smooth skin.   Acne vulgaris Face  Chronic condition with duration over one year. Condition is bothersome to patient. Currently flared. S/p course of isotretinoin approximately 2 years ago.  Start tretinoin 0.025% cream QHS as directed.  Start Aczone 7.5% QAM.  Topical retinoid medications like tretinoin/Retin-A, adapalene/Differin, tazarotene/Fabior, and Epiduo/Epiduo Forte can cause dryness and irritation when first started. Only apply a pea-sized amount to the entire  affected area. Avoid applying it around the eyes, edges of mouth and creases at the nose. If you experience irritation, use a good moisturizer first and/or apply the medicine less often. If you are doing well with the medicine, you can increase how often you use it until you are applying every night. Be careful with sun protection while using this medication as it can make you sensitive to the sun. This medicine should not be used by pregnant women.    Ordered Medications: tretinoin (RETIN-A) 0.025 % cream Dapsone (ACZONE) 7.5 % GEL  Folliculitis buttocks  Recommend Cln sport wash daily.   If not improving may start doxycycline monohydrate 100mg  twice daily with food.   Doxycycline should be taken with food to prevent nausea. Do not lay down for 30 minutes after taking. Be cautious with sun exposure and use good sun protection while on this medication. Pregnant women should not take this medication.    Ordered Medications: doxycycline (MONODOX) 100 MG capsule  Return in about 6 weeks (around 12/08/2020) for Acne.  02/07/2021, RMA, am acting as scribe for Anise Salvo, MD .  Documentation: I have reviewed the above documentation for accuracy and completeness, and I agree with the above.  Darden Dates, MD

## 2020-10-27 NOTE — Patient Instructions (Addendum)
Topical retinoid medications like tretinoin/Retin-A, adapalene/Differin, tazarotene/Fabior, and Epiduo/Epiduo Forte can cause dryness and irritation when first started. Only apply a pea-sized amount to the entire affected area. Avoid applying it around the eyes, edges of mouth and creases at the nose. If you experience irritation, use a good moisturizer first and/or apply the medicine less often. If you are doing well with the medicine, you can increase how often you use it until you are applying every night. Be careful with sun protection while using this medication as it can make you sensitive to the sun. This medicine should not be used by pregnant women.    Recommend AmLactin (ammonium lactate) daily to arms.   Doxycycline should be taken with food to prevent nausea. Do not lay down for 30 minutes after taking. Be cautious with sun exposure and use good sun protection while on this medication. Pregnant women should not take this medication.

## 2020-12-09 ENCOUNTER — Other Ambulatory Visit: Payer: Self-pay

## 2020-12-09 ENCOUNTER — Ambulatory Visit: Payer: BC Managed Care – PPO | Admitting: Dermatology

## 2020-12-09 DIAGNOSIS — L700 Acne vulgaris: Secondary | ICD-10-CM

## 2020-12-09 DIAGNOSIS — L739 Follicular disorder, unspecified: Secondary | ICD-10-CM

## 2020-12-09 MED ORDER — AMZEEQ 4 % EX FOAM: CUTANEOUS | 11 refills | Status: AC

## 2020-12-09 MED ORDER — DAPSONE 7.5 % EX GEL: CUTANEOUS | 11 refills | Status: DC

## 2020-12-09 MED ORDER — TRETINOIN 0.025 % EX CREA: TOPICAL_CREAM | Freq: Every evening | CUTANEOUS | 11 refills | Status: AC

## 2020-12-09 NOTE — Progress Notes (Signed)
   Follow-Up Visit   Subjective  Laura Goodman is a 25 y.o. female who presents for the following: Follow-up (Patient here today for 6 week acne and folliculitis follow up. She is using tretinoin 0.025% cream qhs and aczone 7.5% qam for her acne, advises she has seen some improvement. Patient was using CLN wash for folliculitis at the buttocks, wasn't improving so she started doxycycline 100mg  twice daily about 2 weeks ago. ).   The following portions of the chart were reviewed this encounter and updated as appropriate:   Allergies  Meds  Problems  Med Hx  Surg Hx  Fam Hx      Review of Systems:  No other skin or systemic complaints except as noted in HPI or Assessment and Plan.  Objective  Well appearing patient in no apparent distress; mood and affect are within normal limits.  A focused examination was performed including face, neck, chest and back. Relevant physical exam findings are noted in the Assessment and Plan.  Objective  face: Chest clear Back with trace open comedones Face with few small inflammatory papules, rare open comedones   Assessment & Plan  Acne vulgaris face  Chronic condition with duration or expected duration over one year. Condition is bothersome to patient. Not yet at goal.  Start Amzeeq nightly after applying tretinoin 0.025% cream Continue Aczone 7.5% gel QAM Continue tretinoin 0.025% cream nightly  If doing well, once clear may discontinue tretinoin    Other Related Medications tretinoin (RETIN-A) 0.025 % cream Dapsone (ACZONE) 7.5 % GEL Minocycline HCl Micronized (AMZEEQ) 4 % FOAM  Folliculitis buttocks  May continue doxycycline 100mg  twice daily with food until clear then may discontinue  Doxycycline should be taken with food to prevent nausea. Do not lay down for 30 minutes after taking. Be cautious with sun exposure and use good sun protection while on this medication. Pregnant women should not take this medication.   Consider  switching to low dose doxycycline 20mg  twice daily.   Other Related Medications doxycycline (MONODOX) 100 MG capsule  Return in about 1 year (around 12/09/2021) for Acne.  , RMA, am acting as scribe for , MD .  Documentation: I have reviewed the above documentation for accuracy and completeness, and I agree with the above.  02/08/2022, MD

## 2020-12-09 NOTE — Patient Instructions (Signed)
Topical retinoid medications like tretinoin/Retin-A, adapalene/Differin, tazarotene/Fabior, and Epiduo/Epiduo Forte can cause dryness and irritation when first started. Only apply a pea-sized amount to the entire affected area. Avoid applying it around the eyes, edges of mouth and creases at the nose. If you experience irritation, use a good moisturizer first and/or apply the medicine less often. If you are doing well with the medicine, you can increase how often you use it until you are applying every night. Be careful with sun protection while using this medication as it can make you sensitive to the sun. This medicine should not be used by pregnant women.   Doxycycline should be taken with food to prevent nausea. Do not lay down for 30 minutes after taking. Be cautious with sun exposure and use good sun protection while on this medication. Pregnant women should not take this medication.   

## 2020-12-14 ENCOUNTER — Encounter: Payer: Self-pay | Admitting: Dermatology

## 2021-03-25 ENCOUNTER — Ambulatory Visit: Payer: BC Managed Care – PPO | Admitting: Dermatology

## 2021-08-10 ENCOUNTER — Ambulatory Visit: Admission: EM | Admit: 2021-08-10 | Payer: BC Managed Care – PPO

## 2021-08-11 ENCOUNTER — Encounter: Payer: Self-pay | Admitting: Emergency Medicine

## 2021-08-11 ENCOUNTER — Other Ambulatory Visit: Payer: Self-pay

## 2021-08-11 ENCOUNTER — Ambulatory Visit
Admission: EM | Admit: 2021-08-11 | Discharge: 2021-08-11 | Disposition: A | Discharge status: Home / Self Care | Payer: BC Managed Care – PPO

## 2021-08-11 DIAGNOSIS — J0110 Acute frontal sinusitis, unspecified: Secondary | ICD-10-CM | POA: Diagnosis not present

## 2021-08-11 MED ORDER — AMOXICILLIN 875 MG PO TABS: 875.0000 mg | ORAL_TABLET | Freq: Two times a day (BID) | ORAL | 0 refills | Status: AC

## 2021-08-11 NOTE — Discharge Instructions (Addendum)
Take the amoxicillin as directed.  Follow up with your primary care provider if your symptoms are not improving.   ° ° °

## 2021-08-11 NOTE — ED Triage Notes (Signed)
Pt c/o ST, productive cough with green mucus, and ear pain x 8 days.

## 2021-08-11 NOTE — ED Provider Notes (Signed)
Renaldo Fiddler    CSN: [id number] Arrival date & time: 08/11/21  1733      History   Chief Complaint Chief Complaint  Patient presents with   Cough   Sore Throat    HPI Laura Goodman is a 25 y.o. female.  Patient presents with 8-day history of ear pain, sinus pressure, sore throat, cough productive of green phlegm.  She denies fever, chills, rash, shortness of breath, vomiting, diarrhea, or other symptoms.  Treatment attempted at home with OTC cold medication.  No pertinent medical history.  The history is provided by the patient.   Past Medical History:  Diagnosis Date   Medical history non-contributory     Patient Active Problem List   Diagnosis Date Noted   Gall stones 09/25/2015    Past Surgical History:  Procedure Laterality Date   CHOLECYSTECTOMY N/A 09/17/2015   Procedure: LAPAROSCOPIC CHOLECYSTECTOMY WITH INTRAOPERATIVE CHOLANGIOGRAM;  Surgeon: Earline Mayotte, MD;  Location: [facility] ORS;  Service: General;  Laterality: N/A;   TONSILLECTOMY     WISDOM TOOTH EXTRACTION  10/03/2013    OB History     Gravida  0   Para  0   Term  0   Preterm  0   AB  0   Living  0      SAB  0   IAB  0   Ectopic  0   Multiple  0   Live Births               Home Medications    Prior to Admission medications   Medication Sig Start Date End Date Taking? Authorizing Provider  amoxicillin (AMOXIL) 875 MG tablet Take 1 tablet (875 mg total) by mouth 2 (two) times daily for 10 days. 08/11/21 08/21/21 Yes Mickie Bail, NP  Dapsone (ACZONE) 7.5 % GEL Apply to face every morning as directed. 12/09/20  Yes Moye, [place], MD  famotidine (PEPCID) 20 MG tablet Take by mouth. 08/05/21 08/05/22 Yes [provider]  omeprazole (PRILOSEC) 20 MG capsule Take 20 mg by mouth daily. 08/05/21  Yes [provider]  tretinoin (RETIN-A) 0.025 % cream Apply topically at bedtime. 12/09/20 12/09/21 Yes [place]oye, [place], MD  TRI-SPRINTEC 0.18/0.215/0.25 MG-35 MCG  tablet  07/11/15  Yes [provider]  venlafaxine XR (EFFEXOR-XR) 150 MG 24 hr capsule Take by mouth. 08/11/21  Yes [provider]  DULoxetine (CYMBALTA) 30 MG capsule Take by mouth. 11/11/19 11/10/20  [provider]  Minocycline HCl Micronized (AMZEEQ) 4 % FOAM Apply thin layer to face nightly following tretinoin. 12/09/20   Moye, [place], MD  Multiple Vitamins-Minerals (MULTIVITAMIN WITH MINERALS) tablet Take 1 tablet by mouth daily. Patient not taking: Reported on 10/27/2020    [provider]  Norgestimate-Ethinyl Estradiol Triphasic 0.18/0.215/0.25 MG-35 MCG tablet Take 1 tablet by mouth daily. Patient not taking: Reported on 10/27/2020 09/25/18   [provider]  promethazine (PHENERGAN) 25 MG tablet Take 1 tablet (25 mg total) by mouth every 4 (four) hours as needed for nausea or vomiting. Patient not taking: Reported on 10/27/2020 09/18/15   Earline Mayotte, MD    Family History Family History  Problem Relation Age of Onset   Ehlers-Danlos syndrome Sister    Polycystic ovary syndrome Sister    Cancer Paternal Aunt        Breast   Cancer Paternal Grandmother        Colon     Social History Social History   Tobacco  Use   Smoking status: Never   Smokeless tobacco: Never  Vaping Use   Vaping Use: Never used  Substance Use Topics   Alcohol use: No    Alcohol/week: 0.0 standard drinks   Drug use: No     Allergies   Other   Review of Systems Review of Systems  Constitutional:  Negative for chills and fever.  HENT:  Positive for congestion, postnasal drip, sinus pressure and sore throat. Negative for ear pain.   Respiratory:  Positive for cough. Negative for shortness of breath.   Cardiovascular:  Negative for chest pain and palpitations.  Gastrointestinal:  Negative for diarrhea and vomiting.  Skin:  Negative for color change and rash.  All other systems reviewed and are negative.   Physical Exam Triage Vital Signs ED  Triage Vitals  Enc Vitals Group     BP 08/11/21 1811 120/85     Pulse Rate 08/11/21 1811 86     Resp 08/11/21 1811 18     Temp 08/11/21 1811 98.9 F (37.2 C)     Temp Source 08/11/21 1811 Oral     SpO2 08/11/21 1811 98 %     Weight --      Height --      Head Circumference --      Peak Flow --      Pain Score 08/11/21 1814 8     Pain Loc --      Pain Edu? --      Excl. in GC? --    No data found.  Updated Vital Signs BP 120/85 (BP Location: Left Arm)   Pulse 86   Temp 98.9 F (37.2 C) (Oral)   Resp 18   LMP 07/31/2021   SpO2 98%   Visual Acuity Right Eye Distance:   Left Eye Distance:   Bilateral Distance:    Right Eye Near:   Left Eye Near:    Bilateral Near:     Physical Exam Vitals and nursing note reviewed.  Constitutional:      General: She is not in acute distress.    Appearance: She is well-developed.  HENT:     Head: Normocephalic and atraumatic.     Right Ear: Tympanic membrane normal.     Left Ear: Tympanic membrane normal.     Nose: Congestion present.     Mouth/Throat:     Mouth: Mucous membranes are moist.     Pharynx: Oropharynx is clear.  Eyes:     Conjunctiva/sclera: Conjunctivae normal.  Cardiovascular:     Rate and Rhythm: Normal rate and regular rhythm.     Heart sounds: Normal heart sounds.  Pulmonary:     Effort: Pulmonary effort is normal. No respiratory distress.     Breath sounds: Normal breath sounds.  Abdominal:     Palpations: Abdomen is soft.     Tenderness: There is no abdominal tenderness.  Musculoskeletal:     Cervical back: Neck supple.  Skin:    General: Skin is warm and dry.  Neurological:     Mental Status: She is alert.  Psychiatric:        Mood and Affect: Mood normal.        Behavior: Behavior normal.     UC Treatments / Results  Labs (all labs ordered are listed, but only abnormal results are displayed) Labs Reviewed - No data to display  EKG   Radiology No results  found.  Procedures Procedures (including critical care time)  Medications Ordered  in UC Medications - No data to display  Initial Impression / Assessment and Plan / UC Course  I have reviewed the triage vital signs and the nursing notes.  Pertinent labs & imaging results that were available during my care of the patient were reviewed by me and considered in my medical decision making (see chart for details).   Acute sinusitis.  Patient has been symptomatic for 8 days.  Treatment attempted with OTC medications without relief.  Treating today with amoxicillin.  Instructed patient to follow-up with her PCP if her symptoms are not improving.  She agrees to plan of care.   Final Clinical Impressions(s) / UC Diagnoses   Final diagnoses:  Acute non-recurrent frontal sinusitis     Discharge Instructions      Take the amoxicillin as directed.  Follow up with your primary care provider if your symptoms are not improving.         ED Prescriptions     Medication Sig Dispense Auth. Provider   amoxicillin (AMOXIL) 875 MG tablet Take 1 tablet (875 mg total) by mouth 2 (two) times daily for 10 days. 20 tablet Mickie Bail, NP      PDMP not reviewed this encounter.   Mickie Bail, NP 08/11/21 [phone]

## 2022-01-11 ENCOUNTER — Other Ambulatory Visit: Payer: Self-pay | Admitting: Sports Medicine

## 2022-01-11 DIAGNOSIS — M25852 Other specified joint disorders, left hip: Secondary | ICD-10-CM

## 2022-01-11 DIAGNOSIS — M25552 Pain in left hip: Secondary | ICD-10-CM

## 2022-01-27 ENCOUNTER — Ambulatory Visit
Admission: RE | Admit: 2022-01-27 | Discharge: 2022-01-27 | Disposition: A | Discharge status: Home / Self Care | Payer: BC Managed Care – PPO | Source: Ambulatory Visit | Attending: Sports Medicine | Admitting: Sports Medicine

## 2022-01-27 DIAGNOSIS — M25552 Pain in left hip: Secondary | ICD-10-CM | POA: Insufficient documentation

## 2022-01-27 DIAGNOSIS — M25852 Other specified joint disorders, left hip: Secondary | ICD-10-CM | POA: Diagnosis not present

## 2022-01-27 MED ORDER — GADOBUTROL 1 MMOL/ML IV SOLN
0.0500 mL | Freq: Once | INTRAVENOUS | Status: AC | PRN
  Administered 2022-01-27: 0.05 mL

## 2022-01-27 MED ORDER — LIDOCAINE HCL (PF) 1 % IJ SOLN
5.0000 mL | Freq: Once | INTRAMUSCULAR | Status: AC
  Administered 2022-01-27: 5 mL
  Filled 2022-01-27: qty 5

## 2022-01-27 MED ORDER — SODIUM CHLORIDE (PF) 0.9 % IJ SOLN
10.0000 mL | INTRAMUSCULAR | Status: DC | PRN
  Administered 2022-01-27: 7 mL

## 2022-01-27 MED ORDER — IOHEXOL 180 MG/ML  SOLN
50.0000 mL | Freq: Once | INTRAMUSCULAR | Status: AC | PRN
Start: 2022-01-27 — End: 2022-01-27
  Administered 2022-01-27: 13 mL

## 2022-03-22 ENCOUNTER — Ambulatory Visit: Payer: BC Managed Care – PPO | Admitting: Dermatology

## 2022-03-22 ENCOUNTER — Encounter: Payer: Self-pay | Admitting: Dermatology

## 2022-03-22 DIAGNOSIS — R61 Generalized hyperhidrosis: Secondary | ICD-10-CM

## 2022-03-22 DIAGNOSIS — L700 Acne vulgaris: Secondary | ICD-10-CM | POA: Diagnosis not present

## 2022-03-22 MED ORDER — TRETINOIN 0.025 % EX CREA: TOPICAL_CREAM | Freq: Every day | CUTANEOUS | 5 refills | Status: AC

## 2022-03-22 MED ORDER — DAPSONE 7.5 % EX GEL: CUTANEOUS | 2 refills | Status: AC

## 2022-03-22 MED ORDER — GLYCOPYRROLATE 1 MG PO TABS: ORAL_TABLET | ORAL | 1 refills | Status: AC

## 2022-03-22 NOTE — Progress Notes (Signed)
Follow-Up Visit   Subjective  Laura Goodman is a 26 y.o. female who presents for the following: Acne (Patient here today for 1 year acne follow up. Patient is using tretinoin 0.025%, she has run out of Amzeeq and Aczone 7.5% but advises acne is well controlled. ) and Excessive Sweating (Patient has noticed that she seems to be sweating more then usual over the last year at groin, axilla and face. She has not tried any over the counter products.).  The following portions of the chart were reviewed this encounter and updated as appropriate:   Tobacco  Allergies  Meds  Problems  Med Hx  Surg Hx  Fam Hx      Review of Systems:  No other skin or systemic complaints except as noted in HPI or Assessment and Plan.  Objective  Well appearing patient in no apparent distress; mood and affect are within normal limits.  A focused examination was performed including face. Relevant physical exam findings are noted in the Assessment and Plan.  face Rare open comedone at face Shoulders and chest clear  bilateral axilla, groin, face Excessive sweating    Assessment & Plan  Acne vulgaris face  Chronic condition with duration or expected duration over one year. Currently well-controlled.  Continue tretinoin 0.025% cream qhs May add dapsone 7.5% gel qam if having break outs.  Will d/c Amzeeq  Topical retinoid medications like tretinoin/Retin-A, adapalene/Differin, tazarotene/Fabior, and Epiduo/Epiduo Forte can cause dryness and irritation when first started. Only apply a pea-sized amount to the entire affected area. Avoid applying it around the eyes, edges of mouth and creases at the nose. If you experience irritation, use a good moisturizer first and/or apply the medicine less often. If you are doing well with the medicine, you can increase how often you use it until you are applying every night. Be careful with sun protection while using this medication as it can make you sensitive to the  sun. This medicine should not be used by pregnant women.       tretinoin (RETIN-A) 0.025 % cream - face Apply topically at bedtime.  Related Medications Minocycline HCl Micronized (AMZEEQ) 4 % FOAM Apply thin layer to face nightly following tretinoin.  Dapsone (ACZONE) 7.5 % GEL Apply to face every morning as directed.  Generalized hyperhidrosis bilateral axilla, groin, face  Affecting axillae, trunk, groin, hands. New onset approximately 1 year ago raising concern for possible secondary hyperhidrosis. Will refer to Endocrinology   Discussed and will start glycopyrrolate for symptomatic relief for now. Reviewed - Glycopyrrolate can significantly increase the risk of heat stroke so you should avoid using it in the heat, particularly while active. It can also cause dry mouth, blurred vision, difficulty with urination, headache, constipation, and racing heart. Take it only as directed. Never take more than 8 tablets total per day.  Start glycopyrrolate 1 mg daily for 3 days. If you do not have side effects and are still having excess sweating, increase by 1 mg per day every 3 days up to a maximum of 8 tablets (8 mg) in one day. You will spread out the medication through the day. For example, you will increase from 1 tablet daily to 1 tablet twice daily and then 1 tablet three times daily. If you still do not have good control of sweating and have no side effects, increase to 2 tablets in the morning and one tablet two more times during the day.  DO NOT take more than 8 tablets  per day.   Increase dose SLOWLY only as directed.   Be cautious using this medication in the heat as it can significantly increase the risk of heat stroke. It can also cause dry mouth, blurred vision, difficulty with urination, headache, constipation, and racing heart.    glycopyrrolate (ROBINUL) 1 MG tablet - bilateral axilla, groin, face Start glycopyrrolate 1 mg daily for 3 days. If you do not have side effects  and are still having excess sweating, increase by 1 mg per day every 3 days up to a maximum of 8 tablets (8 mg) in one day. You will spread out the medication through the day.   Return for 6-8 weeks hyperhidrosis.  Anise Salvo, RMA, am acting as scribe for Darden Dates, MD .  Documentation: I have reviewed the above documentation for accuracy and completeness, and I agree with the above.  Darden Dates, MD

## 2022-03-22 NOTE — Patient Instructions (Addendum)
Start glycopyrrolate 1 mg daily for 3 days. If you do not have side effects and are still having excess sweating, increase by 1 mg per day every 3 days up to a maximum of 8 tablets (8 mg) in one day. You will spread out the medication through the day. For example, you will increase from 1 tablet daily to 1 tablet twice daily and then 1 tablet three times daily. If you still do not have good control of sweating and have no side effects, increase to 2 tablets in the morning and one tablet two more times during the day.  DO NOT take more than 8 tablets per day.   Increase dose SLOWLY only as directed.   Be cautious using this medication in the heat as it can significantly increase the risk of heat stroke. It can also cause dry mouth, blurred vision, difficulty with urination, headache, constipation, and racing heart.   Continue tretinoin 0.025% cream at bedtime May add dapsone 7.5% gel in the morning if having break outs.   Topical retinoid medications like tretinoin/Retin-A, adapalene/Differin, tazarotene/Fabior, and Epiduo/Epiduo Forte can cause dryness and irritation when first started. Only apply a pea-sized amount to the entire affected area. Avoid applying it around the eyes, edges of mouth and creases at the nose. If you experience irritation, use a good moisturizer first and/or apply the medicine less often. If you are doing well with the medicine, you can increase how often you use it until you are applying every night. Be careful with sun protection while using this medication as it can make you sensitive to the sun. This medicine should not be used by pregnant women.   Due to recent changes in healthcare laws, you may see results of your pathology and/or laboratory studies on MyChart before the doctors have had a chance to review them. We understand that in some cases there may be results that are confusing or concerning to you. Please understand that not all results are received at the same  time and often the doctors may need to interpret multiple results in order to provide you with the best plan of care or course of treatment. Therefore, we ask that you please give [place] 2 business days to thoroughly review all your results before contacting the office for clarification. Should we see a critical lab result, you will be contacted sooner.   If You Need Anything After Your Visit  If you have any questions or concerns for your doctor, please call our main line at [phone] and press option 4 to reach your doctor's medical assistant. If no one answers, please leave a voicemail as directed and we will return your call as soon as possible. Messages left after 4 pm will be answered the following business day.   You may also send [place] a message via MyChart. We typically respond to MyChart messages within 1-2 business days.  For prescription refills, please ask your pharmacy to contact our office. Our fax number is [phone].  If you have an urgent issue when the clinic is closed that cannot wait until the next business day, you can page your doctor at the number below.    Please note that while we do our best to be available for urgent issues outside of office hours, we are not available 24/7.   If you have an urgent issue and are unable to reach [place], you may choose to seek medical care at your doctor's office, retail clinic, urgent care center, or emergency  room.  If you have a medical emergency, please immediately call 911 or go to the emergency department.  Pager Numbers  - Dr. Nehemiah Massed: [phone]  - Dr. Laurence Ferrari: [phone]  - Dr. Nicole Kindred: [phone]  In the event of inclement weather, please call our main line at [phone] for an update on the status of any delays or closures.  Dermatology Medication Tips: Please keep the boxes that topical medications come in in order to help keep track of the instructions about where and how to use these. Pharmacies typically print  the medication instructions only on the boxes and not directly on the medication tubes.   If your medication is too expensive, please contact our office at [phone] option 4 or send [place] a message through [place].   We are unable to tell what your co-pay for medications will be in advance as this is different depending on your insurance coverage. However, we may be able to find a substitute medication at lower cost or fill out paperwork to get insurance to cover a needed medication.   If a prior authorization is required to get your medication covered by your insurance company, please allow [place] 1-2 business days to complete this process.  Drug prices often vary depending on where the prescription is filled and some pharmacies may offer cheaper prices.  The website www.goodrx.com contains coupons for medications through different pharmacies. The prices here do not account for what the cost may be with help from insurance (it may be cheaper with your insurance), but the website can give you the price if you did not use any insurance.  - You can print the associated coupon and take it with your prescription to the pharmacy.  - You may also stop by our office during regular business hours and pick up a GoodRx coupon card.  - If you need your prescription sent electronically to a different pharmacy, notify our office through [facility] or by phone at [phone] option 4.     Si Usted Necesita Algo Despus de Su Visita  Tambin puede enviarnos un mensaje a travs de Pharmacist, community. Por lo general respondemos a los mensajes de MyChart en el transcurso de 1 a 2 das hbiles.  Para renovar recetas, por favor pida a su farmacia que se ponga en contacto con nuestra oficina. Harland Dingwall de fax es [place] [phone].  Si tiene un asunto urgente cuando la clnica est cerrada y que no puede esperar hasta el siguiente da hbil, puede llamar/localizar a su doctor(a) al nmero que aparece a  continuacin.   Por favor, tenga en cuenta que aunque hacemos todo lo posible para estar disponibles para asuntos urgentes fuera del horario de [place], no estamos disponibles las 24 horas del da, los 7 das de la [place].   Si tiene un problema urgente y no puede comunicarse con nosotros, puede optar por buscar atencin mdica  en el consultorio de su doctor(a), en una clnica privada, en un centro de atencin urgente o en una sala de emergencias.  Si tiene Engineering geologist, por favor llame inmediatamente al 911 o vaya a la sala de emergencias.  Nmeros de bper  - Dr. Nehemiah Massed: [phone]  - Dra. Moye: [phone]  - Dra. Nicole Kindred: [phone]  En caso de inclemencias del [place], por favor llame a Johnsie Kindred principal al [phone] para una actualizacin sobre el [place] de cualquier retraso o cierre.  Consejos para la medicacin en dermatologa: Por favor, guarde las cajas en las que vienen los  medicamentos de uso tpico para ayudarle a seguir las [employer] dnde y cmo usarlos. Las farmacias generalmente imprimen las instrucciones del medicamento slo en las cajas y no directamente en los tubos del [place].   Si su medicamento es muy caro, por favor, pngase en contacto con [place]m [place] llamando al [phone] y presione la opcin 4 o envenos un mensaje a travs de Clinical cytogeneticist.   No podemos decirle cul ser su copago por los medicamentos por adelantado ya que esto es diferente dependiendo de la cobertura de su seguro. Sin embargo, es posible que podamos encontrar un medicamento sustituto a Audiological scientist un formulario para que el seguro cubra el medicamento que se considera necesario.   Si se requiere una autorizacin previa para que su compaa de seguros [place] su medicamento, por favor permtanos de 1 a 2 das hbiles para completar [address]  Los precios de los medicamentos varan con frecuencia dependiendo del Environmental consultant de dnde se surte la receta  y alguna farmacias pueden ofrecer precios ms baratos.  El sitio web www.goodrx.com tiene cupones para medicamentos de Health and safety inspector. Los precios aqu no tienen en cuenta lo que podra costar con la ayuda del seguro (puede ser ms barato con su seguro), pero el sitio web puede darle el precio si no utiliz Tourist information centre manager.  - Puede imprimir el cupn correspondiente y llevarlo con su receta a la farmacia.  - Tambin puede pasar por nuestra oficina durante el horario de atencin regular y Education officer, museum una tarjeta de cupones de GoodRx.  - Si necesita que su receta se enve electrnicamente a una farmacia diferente, informe a nuestra oficina a travs de MyChart de [place] o por telfono llamando al [phone] y presione la opcin 4.

## 2022-03-23 ENCOUNTER — Other Ambulatory Visit: Payer: Self-pay

## 2022-03-23 DIAGNOSIS — R61 Generalized hyperhidrosis: Secondary | ICD-10-CM

## 2022-04-07 ENCOUNTER — Other Ambulatory Visit: Payer: Self-pay

## 2022-04-07 DIAGNOSIS — R61 Generalized hyperhidrosis: Secondary | ICD-10-CM

## 2022-04-07 NOTE — Progress Notes (Signed)
B

## 2022-04-13 ENCOUNTER — Other Ambulatory Visit: Payer: Self-pay | Admitting: Dermatology

## 2022-04-13 DIAGNOSIS — R61 Generalized hyperhidrosis: Secondary | ICD-10-CM

## 2022-04-13 NOTE — Telephone Encounter (Signed)
Refill requested too soon. Rf Denied

## 2022-04-26 ENCOUNTER — Other Ambulatory Visit: Payer: Self-pay

## 2022-04-26 DIAGNOSIS — R61 Generalized hyperhidrosis: Secondary | ICD-10-CM

## 2022-04-26 NOTE — Progress Notes (Addendum)
Updated referral to internal instead of incoming to this correctly goes so [facility] Endo's referral workqueue. aw

## 2022-05-11 ENCOUNTER — Ambulatory Visit: Payer: BC Managed Care – PPO | Admitting: Dermatology

## 2022-06-07 ENCOUNTER — Ambulatory Visit: Payer: BC Managed Care – PPO | Admitting: Dermatology

## 2022-06-10 ENCOUNTER — Encounter: Payer: Self-pay | Admitting: Dermatology

## 2022-06-10 ENCOUNTER — Other Ambulatory Visit: Payer: Self-pay | Admitting: Dermatology

## 2022-06-10 DIAGNOSIS — R61 Generalized hyperhidrosis: Secondary | ICD-10-CM

## 2023-12-11 IMAGING — MR MR HIP*L* W/CM
6 series · 40 of 40 positions shown · IV contrast (gadavist)
Comparison: None.

CLINICAL DATA: Left hip pain for 1 year.  No known injury.

EXAM:
MRI OF THE LEFT HIP WITH CONTRAST
TECHNIQUE: Multiplanar, multisequence MR imaging was performed following the
administration of intravenous contrast.
CONTRAST:  0.05mL GADAVIST GADOBUTROL 1 MMOL/ML IV SOLN

[Series 8: T1 · coronal · left · 4.0mm · 1.48mm/px · 7 of 30 slices shown]
[im 1/30]
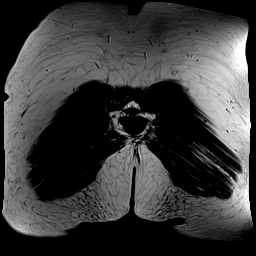
[im 5/30]
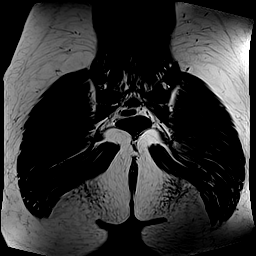
[im 10/30]
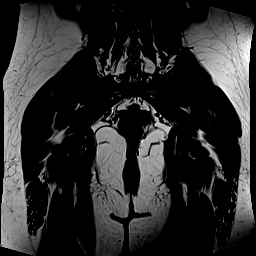
[im 15/30]
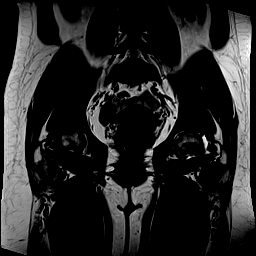
[im 20/30]
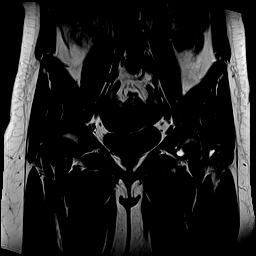
[im 25/30]
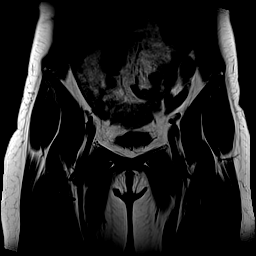
[im 30/30]
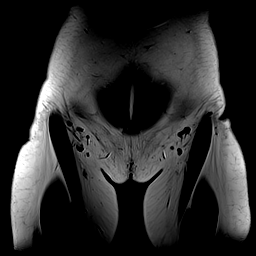

[Series 9: STIR · coronal · left · 4.0mm · 1.48mm/px · 7 of 30 slices shown]
[im 1/30]
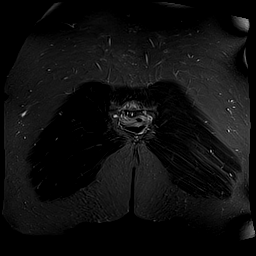
[im 5/30]
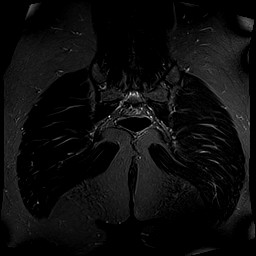
[im 10/30]
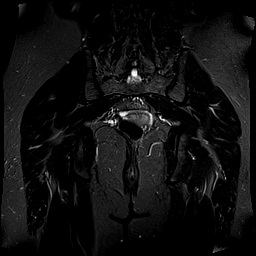
[im 15/30]
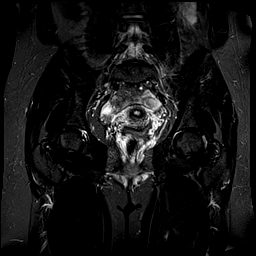
[im 20/30]
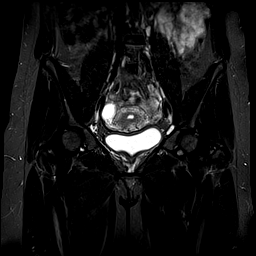
[im 25/30]
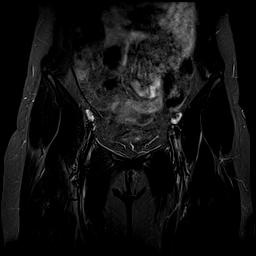
[im 30/30]
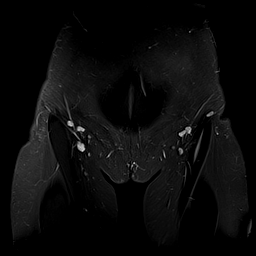

[Series 10: T2 fat-sat · axial · left · 4.0mm · 0.70mm/px · z∈[-124,+20]mm · 7 of 30 slices shown]
[im 1/30]
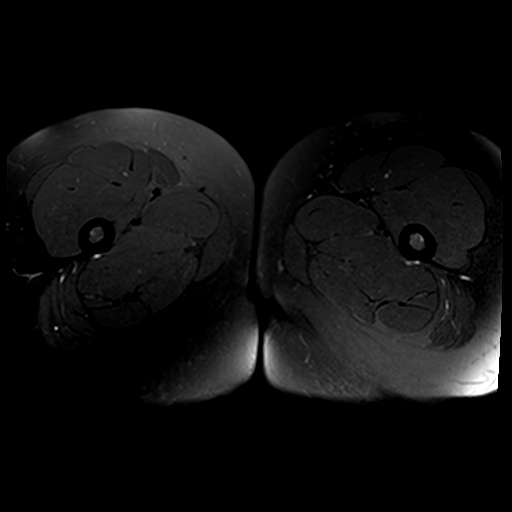
[im 5/30]
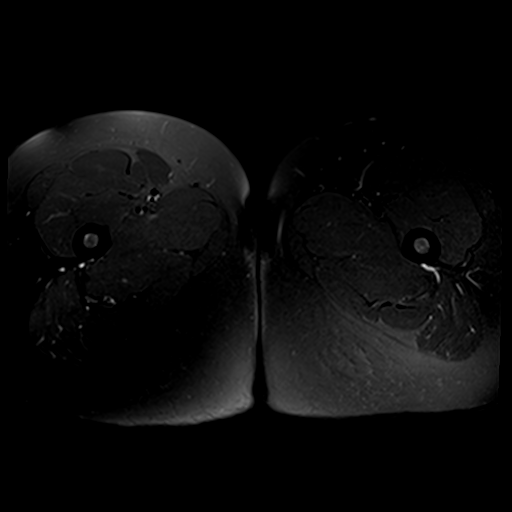
[im 10/30]
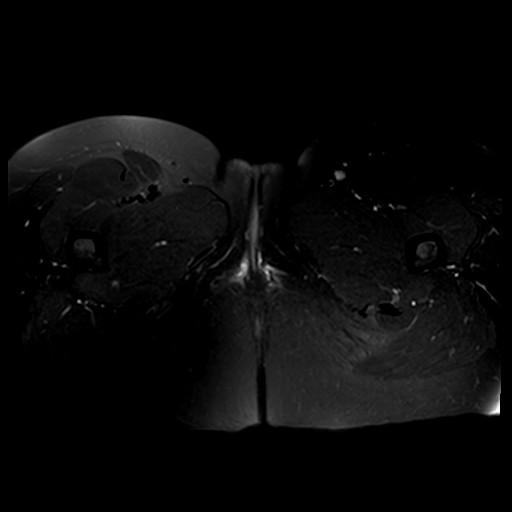
[im 15/30]
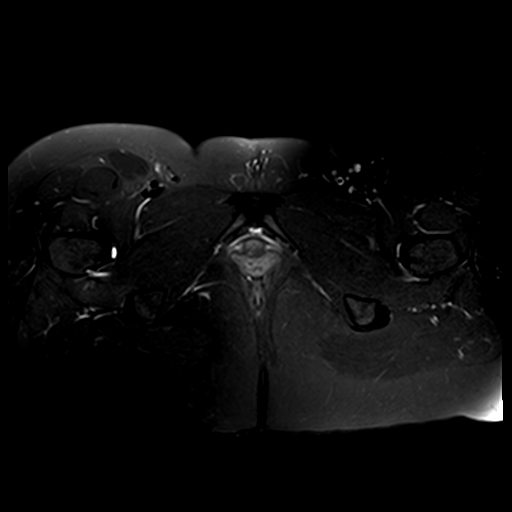
[im 20/30]
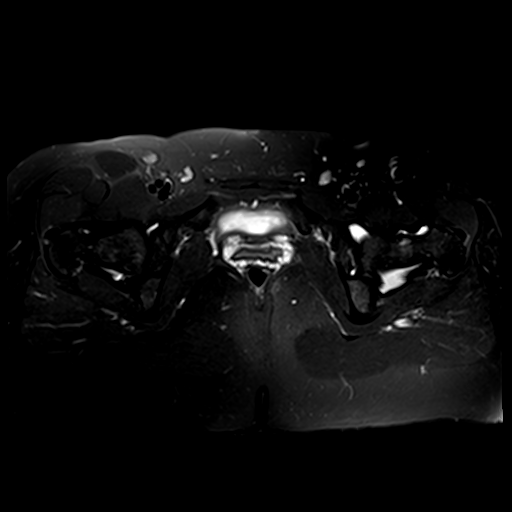
[im 25/30]
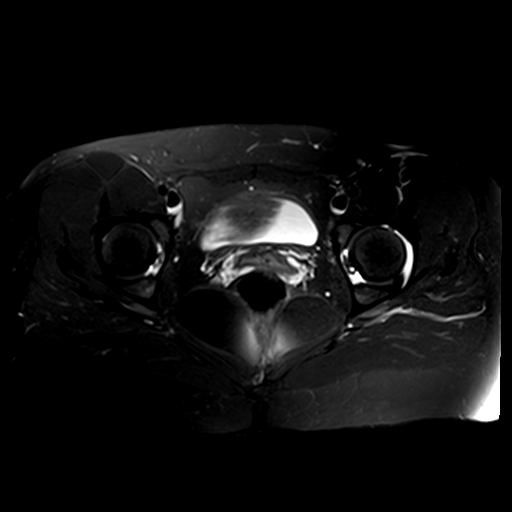
[im 30/30]
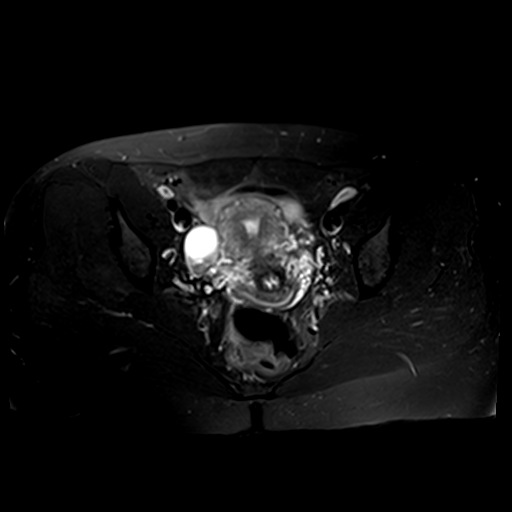

[Series 11: T1 fat-sat · axial · left · 4.0mm · 0.70mm/px · z∈[-117,+13]mm · 7 of 30 slices shown (1 of 3)]
[im 1/30]
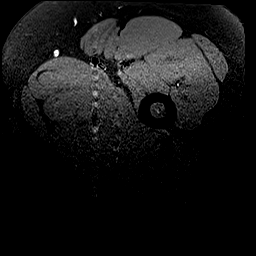
[im 5/30]
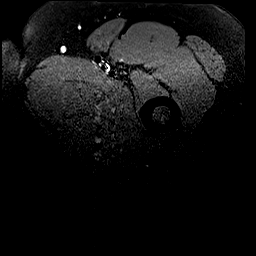
[im 10/30]
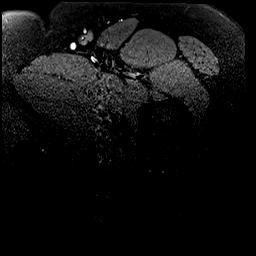
[im 15/30]
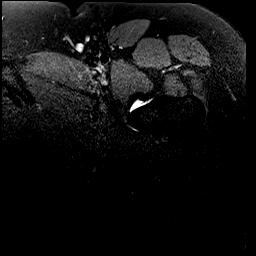
[im 20/30]
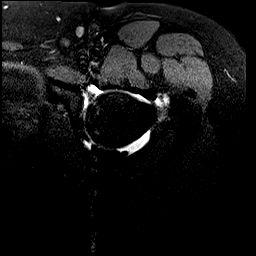
[im 25/30]
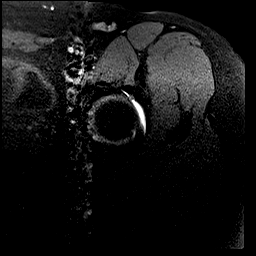
[im 30/30]
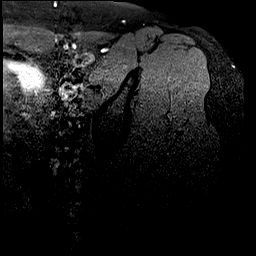

[Series 13: T1 fat-sat · coronal · left · 4.0mm · 0.47mm/px · 6 of 27 slices shown (2 of 3)]
[im 1/27]
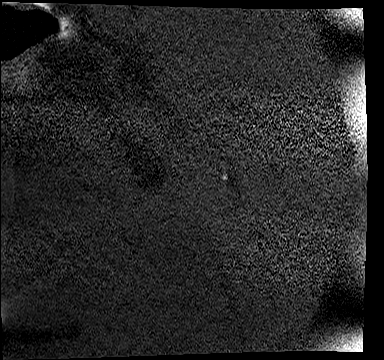
[im 6/27]
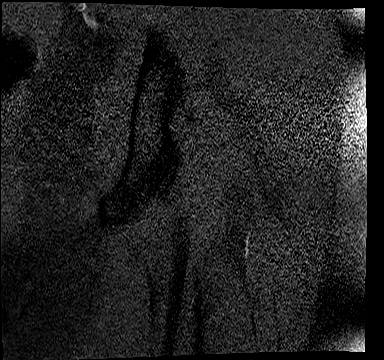
[im 11/27]
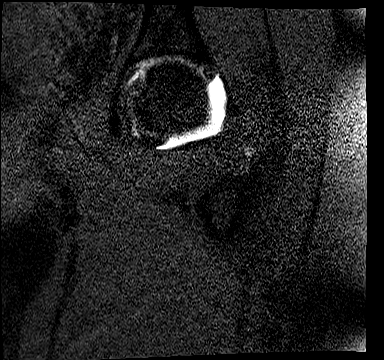
[im 16/27]
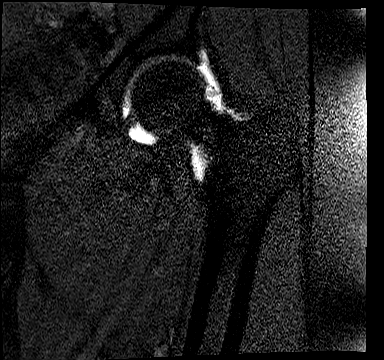
[im 21/27]
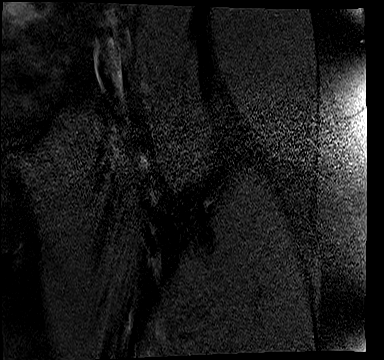
[im 27/27]
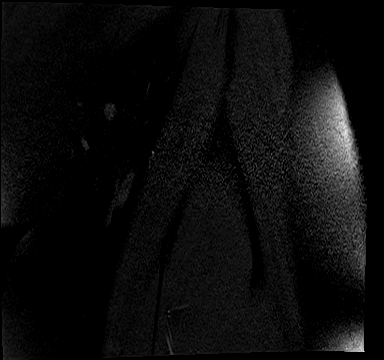

[Series 14: T1 fat-sat · sagittal · left · 4.0mm · 0.77mm/px · 6 of 25 slices shown (3 of 3)]
[im 1/25]
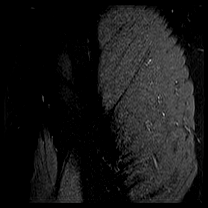
[im 5/25]
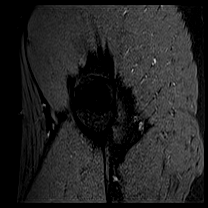
[im 10/25]
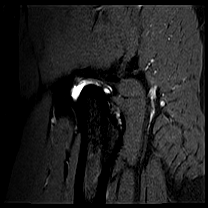
[im 15/25]
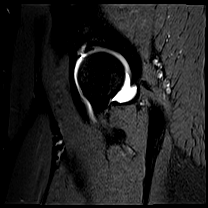
[im 20/25]
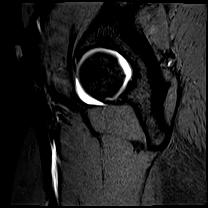
[im 25/25]
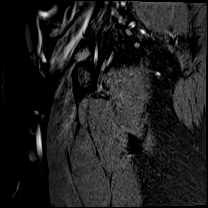

[40 of 40 positions shown; findings below may reference images not displayed]

FINDINGS: Bone

No hip fracture, dislocation or avascular necrosis. No aggressive
osseous lesion.

SI joints are normal. No SI joint widening or erosive changes.

Lower lumbar spine demonstrates no focal abnormality.

Alignment

Normal. No subluxation.

Dysplasia

None.

Joint effusion

Intraarticular contrast distends the left hip joint capsule.
Otherwise, no joint effusions.

Labrum

Left superior labral degeneration and a left anterior labral tear.

Cartilage

No focal chondral defect.

Capsule and ligaments

Normal.

Muscles and Tendons

Flexors: Normal.

Extensors: Normal.

Abductors: Normal.

Adductors: Normal.

Rotators: Normal.

Hamstrings: Normal.

Other Findings

No bursal fluid.

Viscera

No abnormality seen in pelvis. No lymphadenopathy. No free fluid in
the pelvis.
IMPRESSION: 1. Left superior labral degeneration and a left anterior labral
tear. No chondral defect.

## 2023-12-11 IMAGING — RF DG ARTHROGRAM HIP*L*
2 series · 2 of 2 positions shown · non-contrast
Comparison: none

CLINICAL DATA: Patient with history of chronic, worsening left hip
pain with no know injury who presents today for left hip arthrogram
prior to MRI.

[Series 1: fluoro_iodine_singleshot_bw · 0.17mm/px · 1 of 1 slices shown]
[im 1/1]
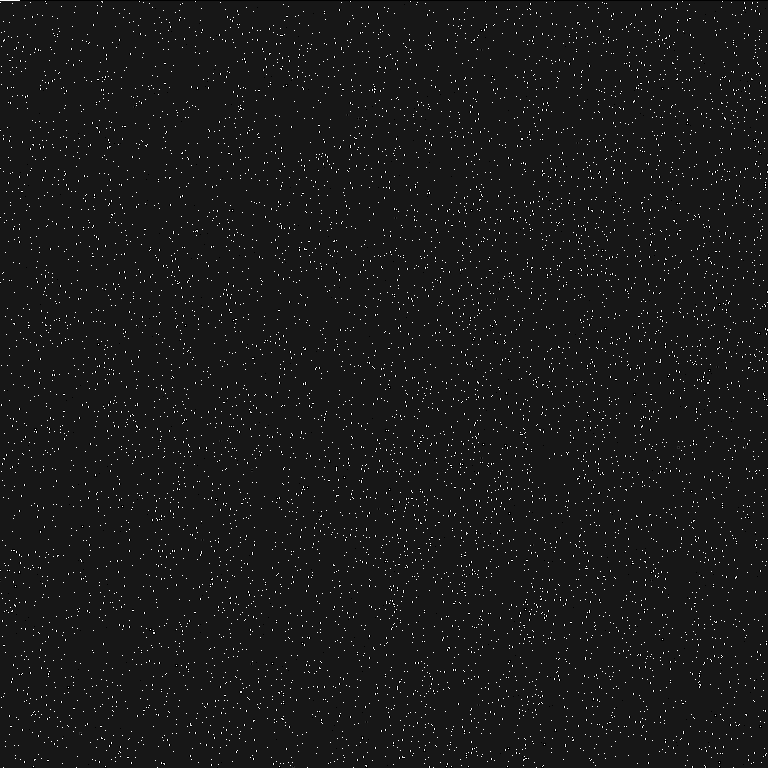

[Series 2: cp_standard · 0.17mm/px · 1 of 1 slices shown]
[im 1/1]
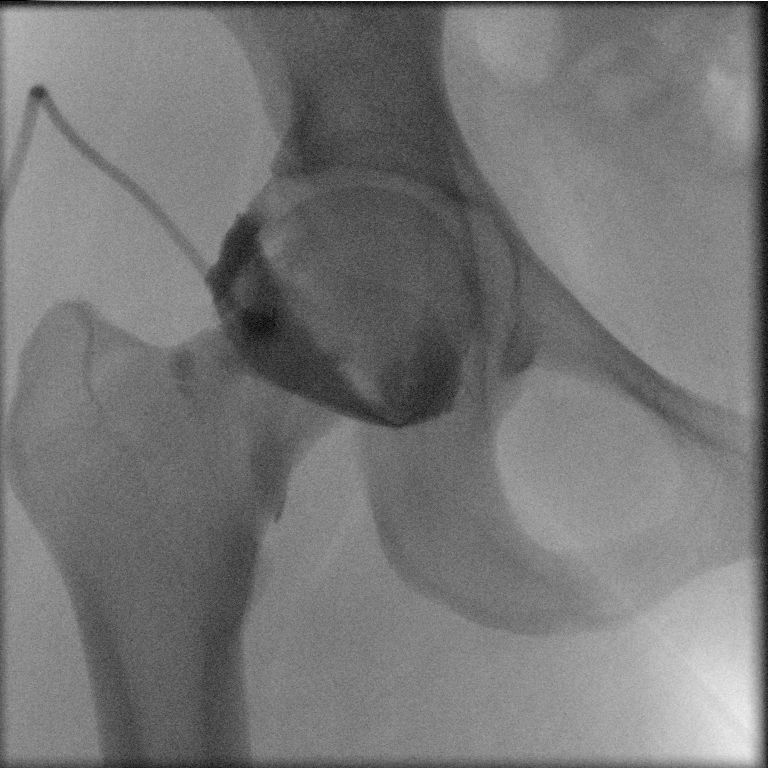

[2 of 2 positions shown; findings below may reference images not displayed]

EXAM:
LEFT HIP INJECTION FOR MRI

PROCEDURE:
After a thorough discussion of risks and benefits of the procedure,
written and oral informed consent was obtained. The consent
discussion included off label use of Gadavist the risk of bleeding,
infection and injury to nerves and adjacent blood vessels.
Extra-articular injection was also a possible risk discussed.

Preliminary localization was performed over the left hip. The area
was marked over the junction of the left femoral head and neck.

After prep and drape in the usual sterile fashion, the skin and
deeper subcutaneous tissues were anesthetized with 1% Lidocaine
without Epinephrine. Under fluoroscopic guidance, a 22 gauge
inch spinal needle was advanced into the joint at the lateral margin
of the junction of the femoral head and neck. Intra-articular
injection of Lidocaine was performed which flowed freely and
subsequently the joint was distended with 12 ml of a [DATE] dilution
of Gadavist contrast. The MR arthrogram solution was as follows: 15
ml of Omnipaque 180 contrast agent, 0.05 mL Gadavist, 5 mL saline.
An end point was felt as well as the patient experiencing pressure
and the injection was discontinued, the needle removed, and a
sterile dressing applied. The patient was taken to MRI for
subsequent imaging.

The patient tolerated the procedure well and there were no
complications.

FLUOROSCOPY:
Radiation Exposure Index (as provided by the fluoroscopic device):
6.10 mGy K[place]a
IMPRESSION: Successful left hip fluoroscopically guided injection.

This exam was performed by Cunanan, Ophie, and was
supervised and interpreted by Tiger, [place].
